# Patient Record
Sex: Male | Born: 1985 | Marital: Single
Health system: Southern US, Community
[De-identification: ages and names within clinical notes are randomized; demographics above are authoritative.]

## PROBLEM LIST (undated history)

## (undated) DIAGNOSIS — F988 Other specified behavioral and emotional disorders with onset usually occurring in childhood and adolescence: Secondary | ICD-10-CM

---

## 2015-05-11 ENCOUNTER — Emergency Department: Payer: No Typology Code available for payment source

## 2015-05-11 ENCOUNTER — Encounter: Payer: Self-pay | Admitting: Emergency Medicine

## 2015-05-11 ENCOUNTER — Emergency Department
Admission: EM | Admit: 2015-05-11 | Discharge: 2015-05-12 | Disposition: A | Discharge status: Home / Self Care | Payer: No Typology Code available for payment source | Attending: Emergency Medicine | Admitting: Emergency Medicine

## 2015-05-11 DIAGNOSIS — Y9389 Activity, other specified: Secondary | ICD-10-CM | POA: Diagnosis not present

## 2015-05-11 DIAGNOSIS — Y92410 Unspecified street and highway as the place of occurrence of the external cause: Secondary | ICD-10-CM | POA: Insufficient documentation

## 2015-05-11 DIAGNOSIS — F17210 Nicotine dependence, cigarettes, uncomplicated: Secondary | ICD-10-CM | POA: Insufficient documentation

## 2015-05-11 DIAGNOSIS — S8992XA Unspecified injury of left lower leg, initial encounter: Secondary | ICD-10-CM | POA: Diagnosis present

## 2015-05-11 DIAGNOSIS — Y998 Other external cause status: Secondary | ICD-10-CM | POA: Insufficient documentation

## 2015-05-11 DIAGNOSIS — S46091A Other injury of muscle(s) and tendon(s) of the rotator cuff of right shoulder, initial encounter: Secondary | ICD-10-CM | POA: Diagnosis not present

## 2015-05-11 DIAGNOSIS — S8991XA Unspecified injury of right lower leg, initial encounter: Secondary | ICD-10-CM | POA: Insufficient documentation

## 2015-05-11 DIAGNOSIS — S80212A Abrasion, left knee, initial encounter: Secondary | ICD-10-CM | POA: Insufficient documentation

## 2015-05-11 DIAGNOSIS — S199XXA Unspecified injury of neck, initial encounter: Secondary | ICD-10-CM | POA: Diagnosis not present

## 2015-05-11 DIAGNOSIS — T148XXA Other injury of unspecified body region, initial encounter: Secondary | ICD-10-CM

## 2015-05-11 DIAGNOSIS — S46101A Unspecified injury of muscle, fascia and tendon of long head of biceps, right arm, initial encounter: Secondary | ICD-10-CM

## 2015-05-11 HISTORY — DX: Other specified behavioral and emotional disorders with onset usually occurring in childhood and adolescence: F98.8

## 2015-05-11 MED ORDER — MORPHINE SULFATE (PF) 4 MG/ML IV SOLN
4.0000 mg | Freq: Once | INTRAVENOUS | Status: AC
  Administered 2015-05-12: 4 mg via INTRAVENOUS
  Filled 2015-05-11: qty 1

## 2015-05-11 MED ORDER — ONDANSETRON HCL 4 MG/2ML IJ SOLN
4.0000 mg | Freq: Once | INTRAMUSCULAR | Status: AC
Start: 2015-05-11 — End: 2015-05-12
  Administered 2015-05-12: 4 mg via INTRAVENOUS
  Filled 2015-05-11: qty 2
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
## 2015-05-11 NOTE — ED Notes (Signed)
Patient transported to MRI 

## 2015-05-12 MED ORDER — OXYCODONE-ACETAMINOPHEN 5-325 MG PO TABS: 1.0000 | ORAL_TABLET | Freq: Once | ORAL | Status: AC

## 2015-05-12 MED ORDER — OXYCODONE-ACETAMINOPHEN 5-325 MG PO TABS
1.0000 | ORAL_TABLET | Freq: Once | ORAL | Status: AC
  Administered 2015-05-12: 1 via ORAL
  Filled 2015-05-12: qty 1
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
## 2023-10-31 ENCOUNTER — Ambulatory Visit: Payer: Self-pay | Admitting: Family Medicine

## 2023-11-04 ENCOUNTER — Ambulatory Visit: Payer: Self-pay | Admitting: Family Medicine
